# Patient Record
Sex: Male | Born: 1990 | Race: Black or African American | Hispanic: No | Marital: Single | Smoking: Current every day smoker
Health system: Southern US, Community
[De-identification: ages and names within clinical notes are randomized; demographics above are authoritative.]

## PROBLEM LIST (undated history)

## (undated) DIAGNOSIS — J302 Other seasonal allergic rhinitis: Secondary | ICD-10-CM

---
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
## 2011-09-11 ENCOUNTER — Other Ambulatory Visit: Payer: Self-pay

## 2011-09-11 ENCOUNTER — Emergency Department (HOSPITAL_COMMUNITY): Payer: Self-pay

## 2011-09-11 ENCOUNTER — Emergency Department (HOSPITAL_COMMUNITY)
Admission: EM | Admit: 2011-09-11 | Discharge: 2011-09-11 | Disposition: A | Discharge status: Home / Self Care | Payer: Self-pay | Attending: Emergency Medicine | Admitting: Emergency Medicine

## 2011-09-11 ENCOUNTER — Encounter (HOSPITAL_COMMUNITY): Payer: Self-pay | Admitting: *Deleted

## 2011-09-11 DIAGNOSIS — J3489 Other specified disorders of nose and nasal sinuses: Secondary | ICD-10-CM | POA: Insufficient documentation

## 2011-09-11 DIAGNOSIS — R071 Chest pain on breathing: Secondary | ICD-10-CM | POA: Insufficient documentation

## 2011-09-11 DIAGNOSIS — R059 Cough, unspecified: Secondary | ICD-10-CM | POA: Insufficient documentation

## 2011-09-11 DIAGNOSIS — R0789 Other chest pain: Secondary | ICD-10-CM

## 2011-09-11 DIAGNOSIS — R05 Cough: Secondary | ICD-10-CM | POA: Insufficient documentation

## 2011-09-11 HISTORY — DX: Other seasonal allergic rhinitis: J30.2

## 2011-09-11 MED ORDER — IBUPROFEN 800 MG PO TABS
800.0000 mg | ORAL_TABLET | Freq: Once | ORAL | Status: AC
  Administered 2011-09-11: 800 mg via ORAL
  Filled 2011-09-11: qty 1

## 2011-09-11 MED ORDER — IBUPROFEN 600 MG PO TABS: 600.0000 mg | ORAL_TABLET | Freq: Four times a day (QID) | ORAL | Status: AC | PRN

## 2011-09-11 MED ORDER — METHOCARBAMOL 750 MG PO TABS: 750.0000 mg | ORAL_TABLET | Freq: Four times a day (QID) | ORAL | Status: AC

## 2011-09-11 NOTE — ED Notes (Signed)
Pt in no apparent distress. Family member instructed on how to get to cafeteria.

## 2011-09-11 NOTE — ED Notes (Signed)
Pt back from x-ray. Pt ambulatory.

## 2011-09-11 NOTE — ED Provider Notes (Signed)
History     CSN: [id number]  Arrival date & time 09/11/11  2952   First MD Initiated Contact with Patient 09/11/11 1008      Chief Complaint  Patient presents with  . Cough  . Chest Pain    (Consider location/radiation/quality/duration/timing/severity/associated sxs/prior treatment) Patient is a 21 y.o. male presenting with cough and chest pain. The history is provided by the patient.  Cough Associated symptoms include chest pain.  Chest Pain Primary symptoms include cough.    patient here with cough and congestion symptoms for one week. Now with sharp chest pain at the left costal margin. Worse with palpation or movement. No pleuritic component to his chest pain. Copies been nonproductive. Patient has used over-the-counter medications without relief. No prior history of this. Denies any leg pain or swelling  Past Medical History  Diagnosis Date  . Seasonal allergies     History reviewed. No pertinent past surgical history.  No family history on file.  History  Substance Use Topics  . Smoking status: Current Everyday Smoker  . Smokeless tobacco: Not on file  . Alcohol Use: No      Review of Systems  Respiratory: Positive for cough.   Cardiovascular: Positive for chest pain.  All other systems reviewed and are negative.    Allergies  Review of patient's allergies indicates no known allergies.  Home Medications  No current outpatient prescriptions on file.  BP 144/86  Pulse 83  Temp(Src) 99.1 F (37.3 C) (Oral)  Resp 18  SpO2 98%  Physical Exam  Nursing note and vitals reviewed. Constitutional: He is oriented to person, place, and time. He appears well-developed and well-nourished.  Non-toxic appearance. No distress.  HENT:  Head: Normocephalic and atraumatic.  Eyes: Conjunctivae, EOM and lids are normal. Pupils are equal, round, and reactive to light.  Neck: Normal range of motion. Neck supple. No tracheal deviation present. No mass present.    Cardiovascular: Normal rate, regular rhythm and normal heart sounds.  Exam reveals no gallop.   No murmur heard. Pulmonary/Chest: Effort normal and breath sounds normal. No stridor. No respiratory distress. He has no decreased breath sounds. He has no wheezes. He has no rhonchi. He has no rales. He exhibits tenderness and bony tenderness. He exhibits no crepitus.    Abdominal: Soft. Normal appearance and bowel sounds are normal. He exhibits no distension. There is no tenderness. There is no rebound and no CVA tenderness.  Musculoskeletal: Normal range of motion. He exhibits no edema and no tenderness.  Neurological: He is alert and oriented to person, place, and time. He has normal strength. No cranial nerve deficit or sensory deficit. GCS eye subscore is 4. GCS verbal subscore is 5. GCS motor subscore is 6.  Skin: Skin is warm and dry. No abrasion and no rash noted.  Psychiatric: He has a normal mood and affect. His speech is normal and behavior is normal.    ED Course  Procedures (including critical care time)  Labs Reviewed - No data to display No results found.   No diagnosis found.    MDM   Date: 09/11/2011  Rate: 68  Rhythm: normal sinus rhythm  QRS Axis: normal  Intervals: normal  ST/T Wave abnormalities: normal  Conduction Disutrbances:none  Narrative Interpretation:   Old EKG Reviewed: none available  10:33 AM  Patient given Motrin here. He is now stable for discharge. Suspect the patient has musculoskeletal chest pain       Toy Baker, MD 09/11/11 1034

## 2011-09-11 NOTE — ED Notes (Signed)
Per EMS. Pt reports productive cough x1 week. Pt had central chest pain this AM. Pt denies radiation, pain not described as pressure. Pt in NSR en route per EMS.

## 2011-09-11 NOTE — ED Notes (Signed)
ZOX:[id number]<AV> Expected date:<BR> Expected time:<BR> Means of arrival:Ambulance<BR> Comments:<BR> Cough, chest pain

## 2011-09-11 NOTE — Discharge Instructions (Signed)
Chest Wall Pain Chest wall pain is pain in or around the bones and muscles of your chest. It may take up to 6 weeks to get better. It may take longer if you must stay physically active in your work and activities.  CAUSES  Chest wall pain may happen on its own. However, it may be caused by:  A viral illness like the flu.   Injury.   Coughing.   Exercise.   Arthritis.   Fibromyalgia.   Shingles.  HOME CARE INSTRUCTIONS   Avoid overtiring physical activity. Try not to strain or perform activities that cause pain. This includes any activities using your chest or your abdominal and side muscles, especially if heavy weights are used.   Put ice on the sore area.   Put ice in a plastic bag.   Place a towel between your skin and the bag.   Leave the ice on for 15 to 20 minutes per hour while awake for the first 2 days.   Only take over-the-counter or prescription medicines for pain, discomfort, or fever as directed by your caregiver.  SEEK IMMEDIATE MEDICAL CARE IF:   Your pain increases, or you are very uncomfortable.   You have a fever.   Your chest pain becomes worse.   You have new, unexplained symptoms.   You have nausea or vomiting.   You feel sweaty or lightheaded.   You have a cough with phlegm (sputum), or you cough up blood.  MAKE SURE YOU:   Understand these instructions.   Will watch your condition.   Will get help right away if you are not doing well or get worse.  Document Released: 05/22/2005 Document Revised: 05/11/2011 Document Reviewed: 01/16/2011 [employer] Patient Information 2012 [employer]. 

## 2011-09-11 NOTE — ED Notes (Signed)
Pt reports chest pain located in left lower ribs that is intermittent for a week. Pt also reports productive cough with yellow sputum. Pt denies generalized body aches or fever. Pt has history of seasonal allergies.

## 2016-01-15 ENCOUNTER — Encounter (HOSPITAL_COMMUNITY): Payer: Self-pay | Admitting: Emergency Medicine

## 2016-01-15 ENCOUNTER — Emergency Department (HOSPITAL_COMMUNITY)
Admission: EM | Admit: 2016-01-15 | Discharge: 2016-01-15 | Disposition: A | Discharge status: Home / Self Care | Attending: Emergency Medicine | Admitting: Emergency Medicine

## 2016-01-15 DIAGNOSIS — W228XXA Striking against or struck by other objects, initial encounter: Secondary | ICD-10-CM | POA: Insufficient documentation

## 2016-01-15 DIAGNOSIS — Y9389 Activity, other specified: Secondary | ICD-10-CM | POA: Insufficient documentation

## 2016-01-15 DIAGNOSIS — F17210 Nicotine dependence, cigarettes, uncomplicated: Secondary | ICD-10-CM | POA: Diagnosis not present

## 2016-01-15 DIAGNOSIS — Y929 Unspecified place or not applicable: Secondary | ICD-10-CM | POA: Diagnosis not present

## 2016-01-15 DIAGNOSIS — S0592XA Unspecified injury of left eye and orbit, initial encounter: Secondary | ICD-10-CM | POA: Diagnosis present

## 2016-01-15 DIAGNOSIS — Y999 Unspecified external cause status: Secondary | ICD-10-CM | POA: Insufficient documentation

## 2016-01-15 DIAGNOSIS — S01112A Laceration without foreign body of left eyelid and periocular area, initial encounter: Secondary | ICD-10-CM | POA: Diagnosis not present

## 2016-01-15 NOTE — Discharge Instructions (Signed)
Your wound was repaired with Dermabond. This will come off on its own in about 7-10 days. Please see your facility physician or return to the emergency department if any signs of infection.

## 2016-01-15 NOTE — ED Notes (Signed)
Numbers provided for triage- [phone] Call attempted- Call picked up then disconnected

## 2016-01-15 NOTE — ED Provider Notes (Signed)
[facility]-EMERGENCY DEPT Provider Note   CSN: [id number] Arrival date & time: 01/15/16  2010  First Provider Contact:  First MD Initiated Contact with Patient 01/15/16 2104        History   Chief Complaint Chief Complaint  Patient presents with  . Eye Injury    HPI Corey White is a 25 y.o. male.  Patient is a 25 year old male who presents to the emergency department with  Laceration/injury to the left eye.  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 He states he was "playing around" and hit a door facing. There was no loss of consciousness. The patient denies any changes in his vision since the incident. No other injury was reported. He has no pain of his teeth or jaw or neck. The patient denies being on any anticoagulation medications. He states that his tetanus status is up-to-date.    Eye Injury     Past Medical History:  Diagnosis Date  . Seasonal allergies     Patient Active Problem List   Diagnosis Date Noted  . TOBACCO USER 06/07/2009  . 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 VENEREAL WART 09/15/2008  . HERPES LABIALIS 12/18/2006    History reviewed. No pertinent surgical history.     Home Medications    Prior to Admission medications   Not on File    Family History No family history on file.  Social History Social History  Substance Use Topics  . Smoking status: Current Every Day Smoker  . Smokeless tobacco: Never Used  . Alcohol use No     Allergies   Review of patient's allergies indicates no known allergies.   Review of Systems Review of Systems  All other systems reviewed and are negative.    Physical Exam Updated Vital Signs BP 137/79 (BP Location: Left Arm)   Pulse 64   Temp 98.3 F (36.8 C) (Oral)   Resp 16   Ht  (1.753 m)   Wt 104.3 kg   SpO2 99%   BMI 33.97 kg/m   Physical Exam  Constitutional: He is oriented to person, place, and time. He appears well-developed and well-nourished.   Non-toxic appearance.  HENT:  Head: Normocephalic. Head is with laceration.    Right Ear: Tympanic membrane and external ear normal.  Left Ear: Tympanic membrane and external ear normal.  Eyes: EOM and lids are normal. Pupils are equal, round, and reactive to light.  The pupils are equal and reactive to light. The extraocular movement is intact. The anterior chamber is clear. The conjunctiva is clear. No swelling or trauma to the eyelids. On funduscopic examination there is no edema, no exudate, no hemorrhage.  Neck: Normal range of motion. Neck supple. Carotid bruit is not present.  Cardiovascular: Normal rate, regular rhythm, normal heart sounds, intact distal pulses and normal pulses.   Pulmonary/Chest: Breath sounds normal. No respiratory distress.  Abdominal: Soft. Bowel sounds are normal. There is no tenderness. There is no guarding.  Musculoskeletal: Normal range of motion.  Lymphadenopathy:       Head (right side): No submandibular adenopathy present.       Head (left side): No submandibular adenopathy present.    He has no cervical adenopathy.  Neurological: He is alert and oriented to person, place, and time. He has normal strength. No cranial nerve deficit or sensory deficit.  Skin: Skin is warm and dry.  Psychiatric: He has a normal mood and affect. His speech is normal.  Nursing  note and vitals reviewed.    ED Treatments / Results  Labs (all labs ordered are listed, but only abnormal results are displayed) Labs Reviewed - No data to display  EKG  EKG Interpretation None       Radiology No results found.  Procedures .Marland Kitchen.Laceration Repair Date/Time: 01/15/2016 9:59 PM Performed by: Ivery QualeBRYANT, Kalyn Hofstra Authorized by: Ivery QualeBRYANT, Tryniti Laatsch   Consent:    Consent obtained:  Verbal   Consent given by:  Patient   Risks discussed:  Infection Anesthesia (see MAR for exact dosages):    Anesthesia method:  None Laceration details:    Location: left eyebrow.   Length (cm):   1.3 Repair type:    Repair type:  Simple Pre-procedure details:    Preparation:  Patient was prepped and draped in usual sterile fashion Exploration:    Wound extent: no foreign bodies/material noted, no muscle damage noted, no nerve damage noted and no vascular damage noted   Treatment:    Area cleansed with:  Shur-Clens   Amount of cleaning:  Standard   Irrigation solution:  Tap water Skin repair:    Repair method:  Tissue adhesive Approximation:    Approximation:  Close   Vermilion border: well-aligned   Post-procedure details:    Dressing:  Open (no dressing)   Patient tolerance of procedure:  Tolerated well, no immediate complications   (including critical care time)  Medications Ordered in ED Medications - No data to display   Initial Impression / Assessment and Plan / ED Course  I have reviewed the triage vital signs and the nursing notes.  Pertinent labs & imaging results that were available during my care of the patient were reviewed by me and considered in my medical decision making (see chart for details).  Clinical Course    *I have reviewed nursing notes, vital signs, and all appropriate lab and imaging results for this patient.**  Final Clinical Impressions(s) / ED Diagnoses  Vital signs within normal limits. Wound repaired without problem. Patient will return if any signs of infection.    Final diagnoses:  None    New Prescriptions New Prescriptions   No medications on file     Ivery QualeHobson Kalah Pflum, Cordelia Poche-C 01/15/16 2201    Eber HongBrian Miller, MD 01/16/16 [phone]

## 2016-01-15 NOTE — ED Notes (Signed)
Unable to discharge pt until report is given to "triage". There are no nurses to receive report where this pt was sent. Pt guard has no number to call and cannot get his facility on the phone to provide this RN a number

## 2016-01-15 NOTE — ED Notes (Signed)
Loney LaurenceH Bryant to bedside to repair with dermabond

## 2016-01-15 NOTE — ED Notes (Signed)
Disconnected- call beck - [facility] the [facility] has no nurse to receive report- call back to [facility] and again put on hold

## 2016-01-15 NOTE — ED Triage Notes (Signed)
Pt with laceration above L. Eye. Injury occurred about 30 mins ago.

## 2019-12-16 ENCOUNTER — Other Ambulatory Visit: Payer: Self-pay

## 2019-12-16 ENCOUNTER — Emergency Department (HOSPITAL_COMMUNITY)

## 2019-12-16 ENCOUNTER — Encounter (HOSPITAL_COMMUNITY): Payer: Self-pay

## 2019-12-16 DIAGNOSIS — Z5321 Procedure and treatment not carried out due to patient leaving prior to being seen by health care provider: Secondary | ICD-10-CM | POA: Insufficient documentation

## 2019-12-16 DIAGNOSIS — R05 Cough: Secondary | ICD-10-CM | POA: Insufficient documentation

## 2019-12-16 DIAGNOSIS — R509 Fever, unspecified: Secondary | ICD-10-CM | POA: Insufficient documentation

## 2019-12-16 MED ORDER — ACETAMINOPHEN 325 MG PO TABS
650.0000 mg | ORAL_TABLET | Freq: Once | ORAL | Status: AC | PRN
  Administered 2019-12-16: 650 mg via ORAL
  Filled 2019-12-16 (×2): qty 2

## 2019-12-16 NOTE — ED Triage Notes (Signed)
Patient arrived stating since this morning he has had productive cough with yellow sputum and a fever. Reports highest temperature at home 103 and took a musinex.

## 2019-12-17 ENCOUNTER — Emergency Department (HOSPITAL_COMMUNITY)
Admission: EM | Admit: 2019-12-17 | Discharge: 2019-12-17 | Disposition: A | Discharge status: Home / Self Care | Attending: Emergency Medicine | Admitting: Emergency Medicine

## 2019-12-17 NOTE — ED Notes (Signed)
Pt called for rooming x1. No answer!! 

## 2020-08-20 IMAGING — CR DG CHEST 2V
2 series · 2 of 2 positions shown · non-contrast
Comparison: 09/11/2011

CLINICAL DATA: Productive cough, mid chest pain since this morning

EXAM:
CHEST - 2 VIEW

[w chest pa]
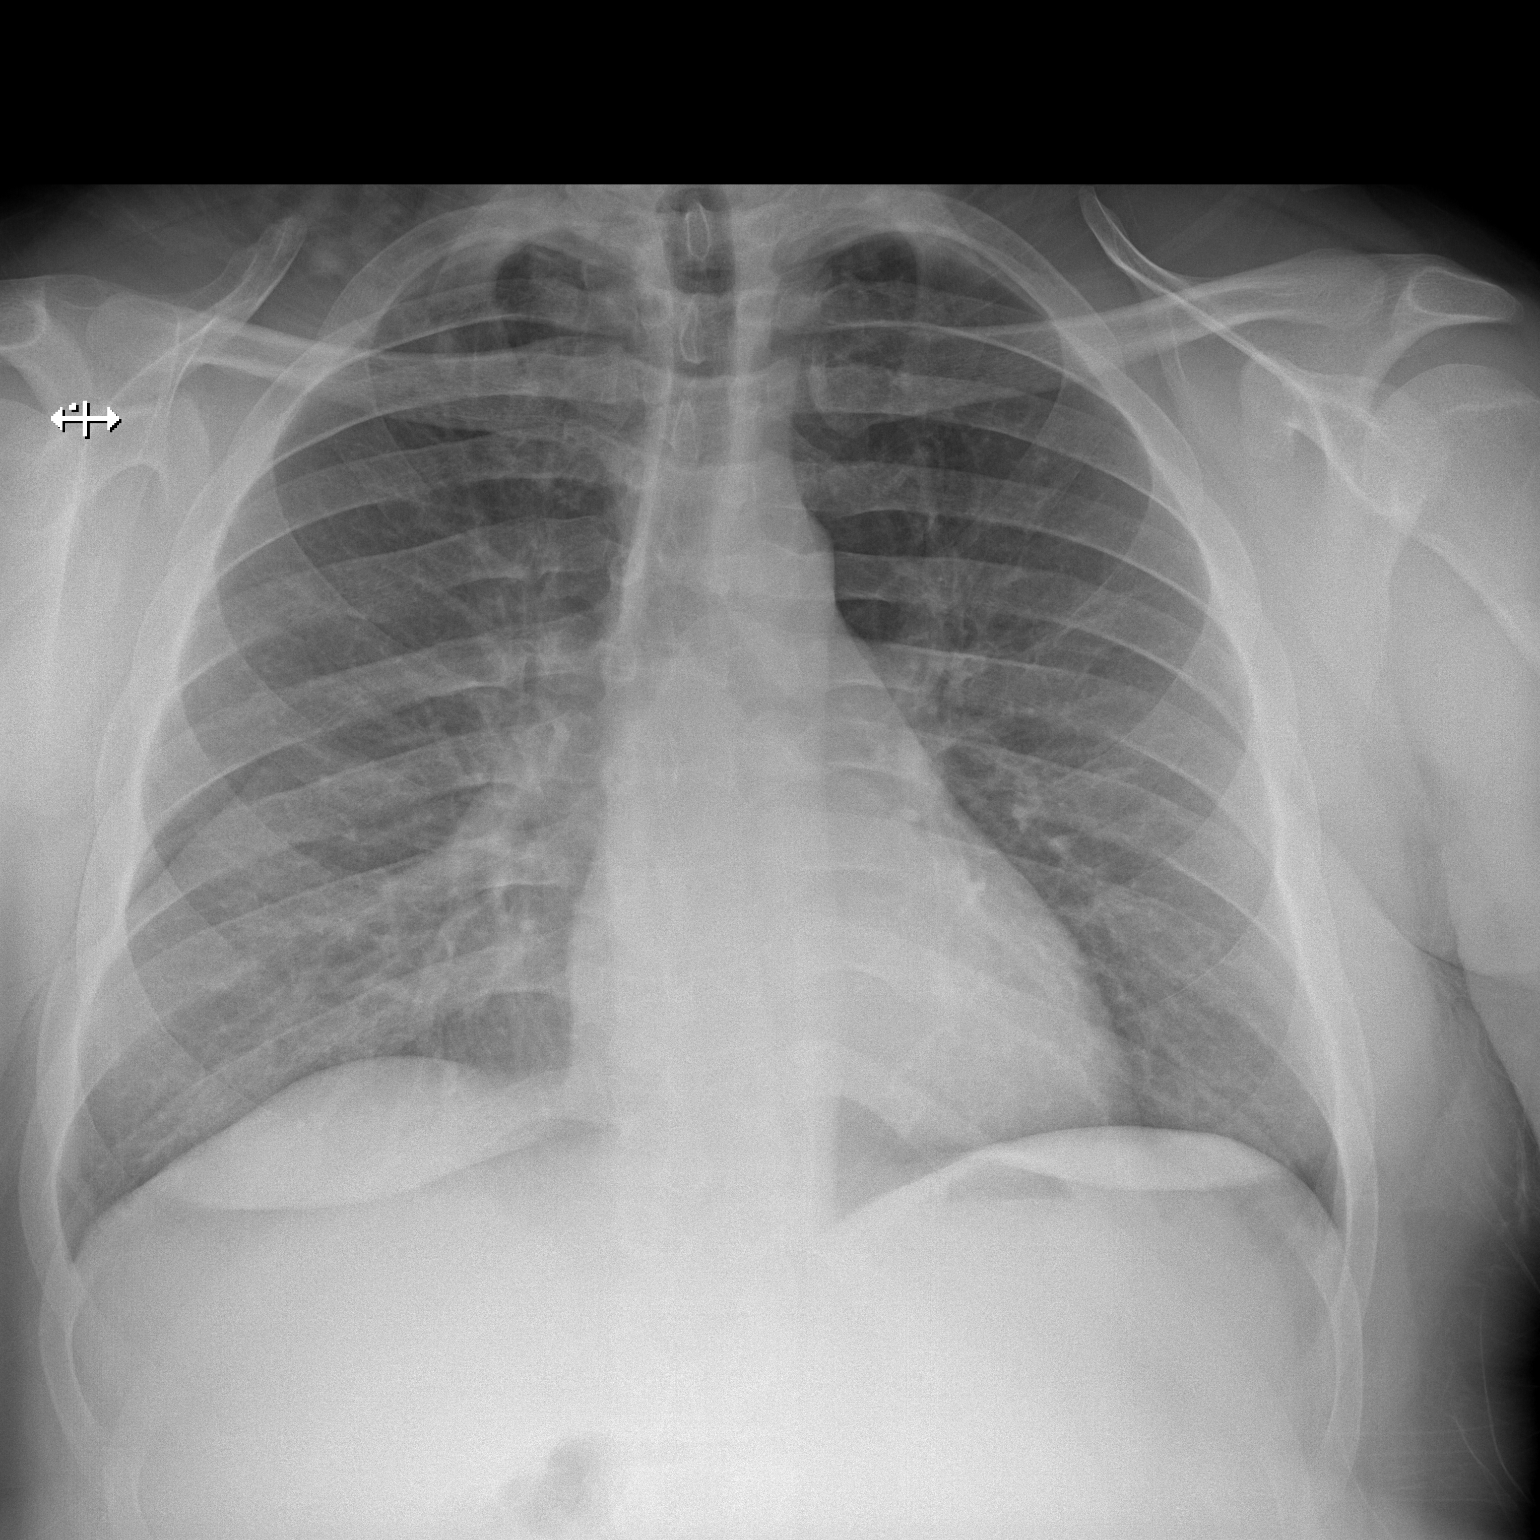

[w chest lat]
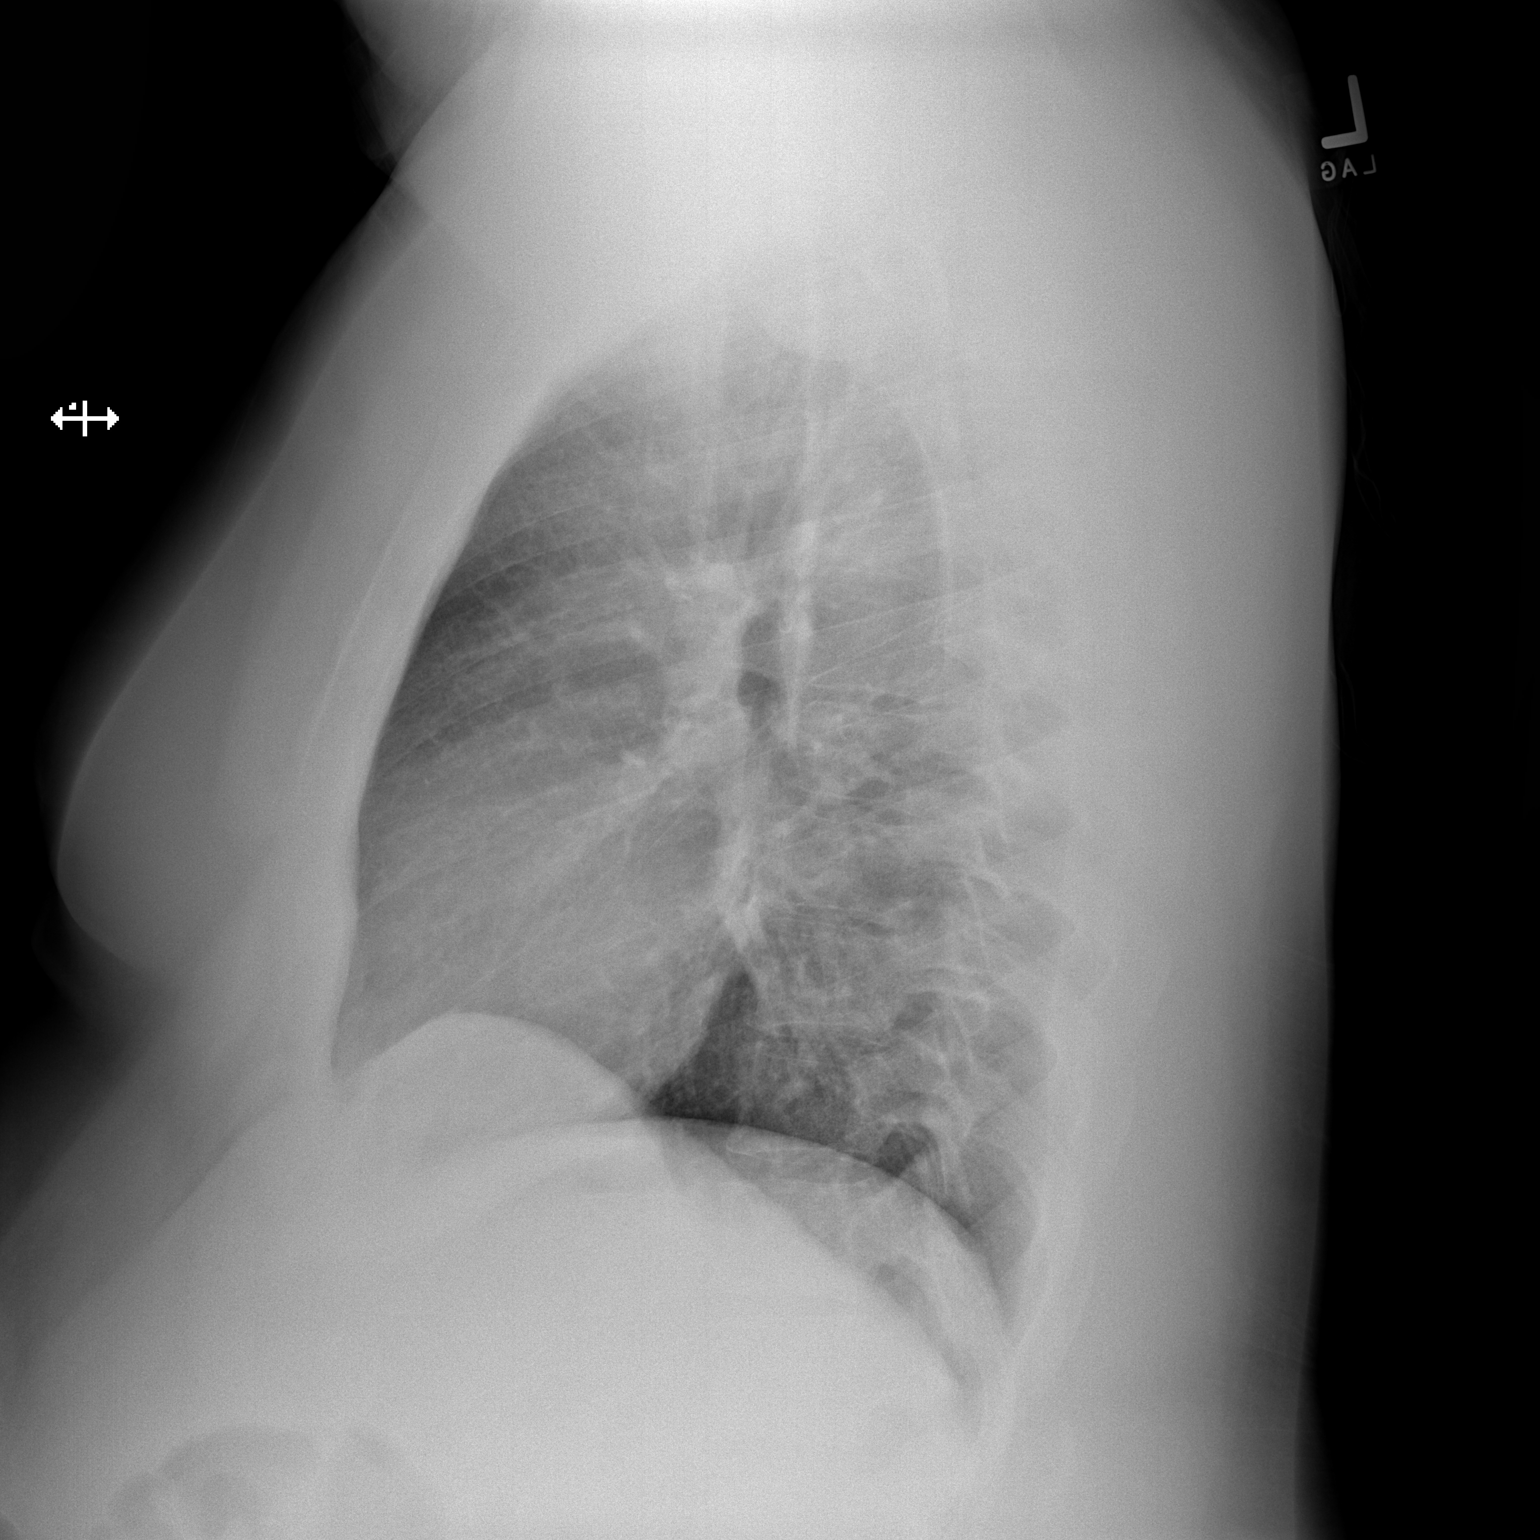

[2 of 2 positions shown; findings below may reference images not displayed]

FINDINGS: Frontal and lateral views of the chest demonstrate an unremarkable
cardiac silhouette. No airspace disease, effusion, or pneumothorax.
No acute bony abnormalities.
IMPRESSION: 1. No acute intrathoracic process.
